# Patient Record
Sex: Female | Born: 1970 | Race: White | Hispanic: No | Marital: Married | Smoking: Never smoker
Health system: Southern US, Community
[De-identification: ages and names within clinical notes are randomized; demographics above are authoritative.]

## PROBLEM LIST (undated history)

## (undated) DIAGNOSIS — R7303 Prediabetes: Secondary | ICD-10-CM

## (undated) DIAGNOSIS — R030 Elevated blood-pressure reading, without diagnosis of hypertension: Secondary | ICD-10-CM

## (undated) DIAGNOSIS — I10 Essential (primary) hypertension: Secondary | ICD-10-CM

## (undated) DIAGNOSIS — E785 Hyperlipidemia, unspecified: Secondary | ICD-10-CM

## (undated) HISTORY — PX: NO PAST SURGERIES: SHX2092

## (undated) HISTORY — DX: Hyperlipidemia, unspecified: E78.5

## (undated) HISTORY — DX: Prediabetes: R73.03

## (undated) HISTORY — DX: Essential (primary) hypertension: I10

## (undated) HISTORY — DX: Elevated blood-pressure reading, without diagnosis of hypertension: R03.0

---

## 1997-12-08 ENCOUNTER — Other Ambulatory Visit: Admission: RE | Admit: 1997-12-08 | Discharge: 1997-12-08 | Payer: Self-pay | Admitting: Obstetrics and Gynecology

## 1997-12-22 ENCOUNTER — Ambulatory Visit (HOSPITAL_COMMUNITY): Admission: RE | Admit: 1997-12-22 | Discharge: 1997-12-22 | Payer: Self-pay | Admitting: Obstetrics & Gynecology

## 1997-12-24 ENCOUNTER — Ambulatory Visit (HOSPITAL_COMMUNITY): Admission: RE | Admit: 1997-12-24 | Discharge: 1997-12-24 | Payer: Self-pay | Admitting: Obstetrics and Gynecology

## 1998-01-14 ENCOUNTER — Ambulatory Visit (HOSPITAL_COMMUNITY): Admission: RE | Admit: 1998-01-14 | Discharge: 1998-01-14 | Payer: Self-pay | Admitting: Obstetrics and Gynecology

## 1999-01-27 ENCOUNTER — Other Ambulatory Visit: Admission: RE | Admit: 1999-01-27 | Discharge: 1999-01-27 | Payer: Self-pay | Admitting: Obstetrics and Gynecology

## 1999-09-05 ENCOUNTER — Observation Stay (HOSPITAL_COMMUNITY): Admission: AD | Admit: 1999-09-05 | Discharge: 1999-09-05 | Payer: Self-pay | Admitting: Obstetrics and Gynecology

## 1999-12-16 ENCOUNTER — Ambulatory Visit (HOSPITAL_COMMUNITY): Admission: RE | Admit: 1999-12-16 | Discharge: 1999-12-16 | Payer: Self-pay | Admitting: Obstetrics & Gynecology

## 1999-12-20 ENCOUNTER — Encounter: Admission: RE | Admit: 1999-12-20 | Discharge: 2000-03-19 | Payer: Self-pay | Admitting: Obstetrics & Gynecology

## 2000-02-29 ENCOUNTER — Inpatient Hospital Stay (HOSPITAL_COMMUNITY): Admission: AD | Admit: 2000-02-29 | Discharge: 2000-03-02 | Payer: Self-pay | Admitting: Obstetrics and Gynecology

## 2000-03-30 ENCOUNTER — Other Ambulatory Visit: Admission: RE | Admit: 2000-03-30 | Discharge: 2000-03-30 | Payer: Self-pay | Admitting: Obstetrics and Gynecology

## 2001-04-25 ENCOUNTER — Other Ambulatory Visit: Admission: RE | Admit: 2001-04-25 | Discharge: 2001-04-25 | Payer: Self-pay | Admitting: Obstetrics and Gynecology

## 2001-12-18 ENCOUNTER — Encounter: Payer: Self-pay | Admitting: Family Medicine

## 2001-12-18 ENCOUNTER — Encounter: Admission: RE | Admit: 2001-12-18 | Discharge: 2001-12-18 | Payer: Self-pay | Admitting: Family Medicine

## 2002-09-30 ENCOUNTER — Other Ambulatory Visit: Admission: RE | Admit: 2002-09-30 | Discharge: 2002-09-30 | Payer: Self-pay | Admitting: Obstetrics and Gynecology

## 2002-11-26 ENCOUNTER — Encounter: Admission: RE | Admit: 2002-11-26 | Discharge: 2002-11-26 | Payer: Self-pay | Admitting: Gastroenterology

## 2002-12-03 ENCOUNTER — Encounter: Admission: RE | Admit: 2002-12-03 | Discharge: 2002-12-03 | Payer: Self-pay | Admitting: Gastroenterology

## 2004-03-02 ENCOUNTER — Other Ambulatory Visit: Admission: RE | Admit: 2004-03-02 | Discharge: 2004-03-02 | Payer: Self-pay | Admitting: Obstetrics and Gynecology

## 2005-03-10 ENCOUNTER — Other Ambulatory Visit: Admission: RE | Admit: 2005-03-10 | Discharge: 2005-03-10 | Payer: Self-pay | Admitting: Obstetrics and Gynecology

## 2005-07-05 ENCOUNTER — Ambulatory Visit (HOSPITAL_COMMUNITY): Admission: RE | Admit: 2005-07-05 | Discharge: 2005-07-05 | Payer: Self-pay | Admitting: Obstetrics and Gynecology

## 2005-09-19 ENCOUNTER — Inpatient Hospital Stay (HOSPITAL_COMMUNITY): Admission: AD | Admit: 2005-09-19 | Discharge: 2005-09-25 | Payer: Self-pay | Admitting: Obstetrics and Gynecology

## 2010-02-17 ENCOUNTER — Ambulatory Visit
Admission: RE | Admit: 2010-02-17 | Discharge: 2010-02-17 | Payer: Self-pay | Source: Home / Self Care | Attending: Family Medicine | Admitting: Family Medicine

## 2010-02-17 DIAGNOSIS — D649 Anemia, unspecified: Secondary | ICD-10-CM | POA: Insufficient documentation

## 2010-02-17 DIAGNOSIS — J309 Allergic rhinitis, unspecified: Secondary | ICD-10-CM | POA: Insufficient documentation

## 2010-02-20 ENCOUNTER — Encounter: Payer: Self-pay | Admitting: Family Medicine

## 2010-03-03 NOTE — Assessment & Plan Note (Signed)
Summary: new acute-ear issues--dizziness-ok per dr//ccm   Vital Signs:  Patient profile:   40 year old female Height:      62.5 inches Weight:      159 pounds BMI:     28.72 Temp:     98.2 degrees F oral Pulse rate:   108 / minute Pulse rhythm:   regular BP sitting:   142 / 90  (left arm) Cuff size:   regular  Vitals Entered By: Alfred Levins, CMA (February 17, 2010 3:38 PM)  Nutrition Counseling: Patient's BMI is greater than 25 and therefore counseled on weight management options. CC: est, rt earache this am, dizziness x2 wks   History of Present Illness: New patient to establish care. Patient seen with acute problem of mild right earache this morning. Intermittent symptoms for 2 weeks. She's had some intermittent very brief vertigo. No hearing changes. Recent URI type symptoms. She also some chronic perennial allergies and takes over-the-counter medications for that. Denies fever. No left ear symptoms.  Past medical history significant for remote history of anemia. History of frequent sinus infections. Perennial allergies. No prescription medications. Allergy to sulfa. Allergy to dust mite and mold and controlled fairly well with OTC antihistamine.  Family history significant for father CABG age 1. Probable history of hyperlipidemia and hypertension. Mother type 2 diabetes.  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 Nonsmoker. Occasional alcohol use.  Preventive Screening-Counseling & Management  Alcohol-Tobacco     Smoking Status: never  Caffeine-Diet-Exercise     Does Patient Exercise: no      Drug Use:  no.    Allergies (verified): 1)  ! Sulfa  Past History:  Family History: Last updated: 02/17/2010 Family History of CAD Female 1st degree relative <50 Fa CABG 67 Family History Diabetes 1st degree relative Mother Type 2 Family History High cholesterol Family History Hypertension  Social History: Last updated: 02/17/2010 Married Never Smoked Alcohol use-yes Drug  use-no Regular exercise-no  Risk Factors: Exercise: no (02/17/2010)  Risk Factors: Smoking Status: never (02/17/2010)  Past Medical History: Allergic rhinitis Anemia-NOS Chickenpox  Past Surgical History: Denies surgical history PMH-FH-SH reviewed for relevance  Family History: Family History of CAD Female 1st degree relative <50 Fa CABG 62 Family History Diabetes 1st degree relative Mother Type 2 Family History High cholesterol Family History Hypertension  Social History: Married Never Smoked Alcohol use-yes Drug use-no Regular exercise-no Smoking Status:  never Drug Use:  no Does Patient Exercise:  no  Review of Systems       The patient complains of weight gain.  The patient denies anorexia, fever, weight loss, decreased hearing, hoarseness, chest pain, syncope, dyspnea on exertion, peripheral edema, prolonged cough, headaches, hemoptysis, abdominal pain, melena, hematochezia, severe indigestion/heartburn, difficulty walking, and enlarged lymph nodes.    Physical Exam  General:  Well-developed,well-nourished,in no acute distress; alert,appropriate and cooperative throughout examination Eyes:  No corneal or conjunctival inflammation noted. EOMI. Perrla. Funduscopic exam benign, without hemorrhages, exudates or papilledema. Vision grossly normal. Ears:  small R serous effusion.  No erythema.  L TM normal. Mouth:  Oral mucosa and oropharynx without lesions or exudates.  Teeth in good repair. Neck:  No deformities, masses, or tenderness noted. Lungs:  Normal respiratory effort, chest expands symmetrically. Lungs are clear to auscultation, no crackles or wheezes. Heart:  Normal rate and regular rhythm. S1 and S2 normal without gallop, murmur, click, rub or other extra sounds. Extremities:  No clubbing, cyanosis, edema, or deformity noted with normal full range of motion of  all joints.   Neurologic:  alert & oriented X3, cranial nerves II-XII intact, strength normal in all  extremities, and finger-to-nose normal.  Mild horizontal nystagmus. Psych:  normally interactive, good eye contact, not anxious appearing, and not depressed appearing.     Impression & Recommendations:  Problem # 1:  ACUTE SEROUS OTITIS MEDIA (ICD-381.01) reassurance.  Cont antihistamines for allergy symptoms.  Problem # 2:  INTERMITTENT VERTIGO (ICD-780.4) prob sec to #1.  Problem # 3:  ALLERGIC RHINITIS (ICD-477.9) good control with OTC meds and she will cont the same.  Complete Medication List: 1)  Ibuprofen 800 Mg Tabs (Ibuprofen) .... Once daily as needed 2)  [place]  .Marland Kitchen.. 1 by mouth once daily as needed 3)  Pseudophedrine  .Marland KitchenMarland Kitchen. 1 by mouth once daily  Patient Instructions: 1)  Call or follow up if you have any new neurologic symptoms, hearing loss, or any persistent vertigo. 2)  You have a small serous effusion and these usually are self-limited and resolve over time without specific drug therapy.   Orders Added: 1)  New Patient Level III [69629]

## 2010-06-17 NOTE — Discharge Summary (Signed)
NAMEWILDA, Brooke Potter            ACCOUNT NO.:  [id number]   MEDICAL RECORD NO.:  [id number]          PATIENT TYPE:  INP   LOCATION:  9104                          FACILITY:  [facility]   PHYSICIAN:  Gerrit Friends. Aldona Bar, M.D.   DATE OF BIRTH:  02-28-1970   DATE OF ADMISSION:  09/19/2005  DATE OF DISCHARGE:  09/25/2005                                 DISCHARGE SUMMARY   DISCHARGE DIAGNOSES:  1. Term pregnancy delivered 7 pound 15 ounce female infant, Apgars 9 and      9.  2. Blood type A negative, Rhogam given.  3. Postpartum pre-eclampsia.   PROCEDURE:  1. Normal spontaneous delivery.  2. Second degree tear and repair.  3. Magnesium sulfate treatment for severe pre-eclampsia postpartum.   SUMMARY:  This 40 year old gravida 3, para 1 was admitted at term in labor  with rupture of membranes.  At time of admission her blood pressure was  145/86.  She did also have a slightly low platelet count on admission  (105,000).  She labored and delivered a 7 pound 15 ounce female infant with  good Apgars over a second degree tear which was repaired without difficulty.  She did receive Rhogam during the early part of her postpartum course.  On  August 25, her pressure was 140/92.  Her liver function tests were slightly  elevated and later during the day on August 25, her pressure elevated to the  160/100 level and she was transferred to the AICU and magnesium sulfate was  begun. Her labs were not markedly abnormal.  On August 26 she was  transferred back to the mother baby unit.  Lab work was repeated.  When I  saw the patient on the morning of September 25, 2005, her blood pressure was  130/82, she had 3+ reflexes and liver function tests which were done on  August 26 were found to be elevated more so than when she was placed in the  AICU to treat her severe pre-eclampsia postpartum.  Her SGOT ws 147, her  SGPT was 164.  Both of these were from August 26.  Her medication of  Procardia to treat her blood  pressure which apparently was somewhat elevated  was changed to Labetalol initially 200 mg every 8 hours and ultimately  increased to 300 mg every 8 hours.  A repeat CBC on the morning of August 27  was normal with normal platelet count.  Her SGOT was 90, down from 147 and  her SGPT was 135 down from 164.  Her LDH was 281, slightly elevated.  A  repeat blood pressure at 1:30 p.m. on September 25, 2005 was 147/97.  The  patient had 2 to 3+ reflexes but no clonus, was comfortable and after a long  discussion with the patient decision was made to sent her home with relative  inactivity at home, continuation of Labetalol 300 mg every eight hours,  continuation of her prenatal vitamins 1 a day, Motrin 400 to 600 mg every  four to six hours as needed for cramping and Tylox one to two every four to  six hours as  needed for severe pain.  She will check in with the office on  August 28 and be seen again on August 29 at which time lab work should be  repeated.  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   The patient will remain at relative inactivity for at least the next 48  hours.  This was stressed and well understood by the patient.   CONDITION ON DISCHARGE:  Somewhat improved.      Gerrit Friends. Aldona Bar, M.D.  Electronically Signed     RMW/MEDQ  D:  09/25/2005  T:  09/26/2005  Job:  [id number]

## 2014-02-12 LAB — HM PAP SMEAR: HM PAP: NEGATIVE

## 2014-05-06 ENCOUNTER — Encounter: Payer: Self-pay | Admitting: Nurse Practitioner

## 2015-02-09 MED FILL — FLUoxetine HCL 10 MG TABS: 10 | 30 days supply | Qty: 30 | Fill #6

## 2017-11-01 ENCOUNTER — Encounter: Payer: Self-pay | Admitting: Sports Medicine

## 2017-11-01 ENCOUNTER — Telehealth: Payer: Self-pay | Admitting: Sports Medicine

## 2017-11-01 ENCOUNTER — Ambulatory Visit (INDEPENDENT_AMBULATORY_CARE_PROVIDER_SITE_OTHER): Payer: 59 | Admitting: Sports Medicine

## 2017-11-01 ENCOUNTER — Ambulatory Visit (INDEPENDENT_AMBULATORY_CARE_PROVIDER_SITE_OTHER): Payer: 59

## 2017-11-01 DIAGNOSIS — X58XXXA Exposure to other specified factors, initial encounter: Secondary | ICD-10-CM | POA: Diagnosis not present

## 2017-11-01 DIAGNOSIS — M7989 Other specified soft tissue disorders: Secondary | ICD-10-CM | POA: Diagnosis not present

## 2017-11-01 DIAGNOSIS — S99922A Unspecified injury of left foot, initial encounter: Secondary | ICD-10-CM | POA: Diagnosis not present

## 2017-11-01 DIAGNOSIS — M25562 Pain in left knee: Secondary | ICD-10-CM | POA: Diagnosis not present

## 2017-11-01 DIAGNOSIS — Y9301 Activity, walking, marching and hiking: Secondary | ICD-10-CM | POA: Diagnosis not present

## 2017-11-01 DIAGNOSIS — M79672 Pain in left foot: Secondary | ICD-10-CM | POA: Diagnosis not present

## 2017-11-01 DIAGNOSIS — S8992XA Unspecified injury of left lower leg, initial encounter: Secondary | ICD-10-CM

## 2017-11-01 DIAGNOSIS — M79662 Pain in left lower leg: Secondary | ICD-10-CM | POA: Diagnosis not present

## 2017-11-01 MED ORDER — MELOXICAM 15 MG PO TABS: ORAL_TABLET | ORAL | 3 refills | Status: AC

## 2017-11-01 NOTE — Telephone Encounter (Signed)
Ms Detter fell..she believes she has fractured her left leg and there is a lot of swelling. She will be here about 4:00

## 2017-11-01 NOTE — Progress Notes (Signed)
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  Did not really get much bruising but the swelling continued to the point where her left anterolateral shin was very tense.  She calls in for an urgent appointment.  I reviewed the past medical history, family history, social history, surgical history, and allergies today and no changes were needed.  Please see the problem list section below in epic for further details.  Past Medical History: Past Medical History:  Diagnosis Date  . Elevated blood pressure reading    Past Surgical History: Past Surgical History:  Procedure Laterality Date  . NO PAST SURGERIES     Social History: Social History   Socioeconomic History  . Marital status: Single    Spouse name: Not on file  . Number of children: Not on file  . Years of education: Not on file  . Highest education level: Not on file  Occupational History  . Not on file  Social Needs  . Financial resource strain: Not on file  . Food insecurity:    Worry: Not on file    Inability: Not on file  . Transportation needs:    Medical: Not on file    Non-medical: Not on file  Tobacco Use  . Smoking status: Never Smoker  . Smokeless tobacco: Never Used  Substance and Sexual Activity  . Alcohol use: Never    Frequency: Never  . Drug use: Never  . Sexual activity: Yes  Lifestyle  . Physical activity:    Days per week: Not on file    Minutes per session: Not on file  . Stress: Not on file  Relationships  . Social connections:    Talks on phone: Not on file    Gets together: Not on file    Attends religious service: Not on file    Active member of club or organization: Not on file    Attends meetings of clubs or organizations: Not on file      Relationship status: Not on file  Other Topics Concern  . Not on file  Social History Narrative  . Not on file   Family History: No family history on file. Allergies: Allergies  Allergen Reactions  . Sulfonamide Derivatives    Medications: See med rec.  Review of Systems: No headache, visual changes, nausea, vomiting, diarrhea, constipation, dizziness, abdominal pain, skin rash, fevers, chills, night sweats, swollen lymph nodes, weight loss, chest pain, body aches, joint swelling, muscle aches, shortness of breath, mood changes, visual or auditory hallucinations.  Objective:    General: Well Developed, well nourished, and in no acute distress.  Neuro: Alert and oriented x3, extra-ocular muscles intact, sensation grossly intact.  HEENT: Normocephalic, atraumatic, pupils equal round reactive to light, neck supple, no masses, no lymphadenopathy, thyroid nonpalpable.  Skin: Warm and dry, no rashes noted.  Cardiac: Regular rate and rhythm, no murmurs rubs or gallops.  Respiratory: Clear to auscultation bilaterally. Not using accessory muscles, speaking in full sentences.  Abdominal: Soft, nontender, nondistended, positive bowel sounds, no masses, no organomegaly.  Left lower leg: Visibly swollen, nontender, no bruising, only a minimal abrasion over the superficial skin.  Neurovascularly intact distally with a negative Homans sign.  X-rays of the knee, tib-fib, ankle reviewed and  are negative for fracture.  Procedure: Diagnostic Ultrasound of left lower leg Device: GE Logiq E  Findings: Noted edematous subcutaneous tissues, overlying the muscle fascia, no discrete collection visible that would be amenable to needle aspiration. Images permanently stored and available for review in the ultrasound unit.  Impression: Subcutaneous edema without fascial edema or discrete collection.  The calf and ankle were strapped with a compressive dressing.  Impression and Recommendations:    The  patient was counselled, risk factors were discussed, anticipatory guidance given.  Left leg injury Knee, tib-fib, ankle x-rays are negative for fracture. Anterolateral leg swelling, ultrasound simply showed subcutaneous edema, no discrete hematoma or collection for drainage. Strap with compressive dressing, meloxicam daily for 2 weeks, icing 20 minutes 3-4 times a day, return to see me in 2 weeks as needed. She will establish with 1 of my partners. ___________________________________________ Ihor Austin. Benjamin Stain, M.D., ABFM., CAQSM. Primary Care and Sports Medicine [facility]  Adjunct Instructor of Family Medicine  [employer]

## 2017-11-01 NOTE — Assessment & Plan Note (Signed)
Knee, tib-fib, ankle x-rays are negative for fracture. Anterolateral leg swelling, ultrasound simply showed subcutaneous edema, no discrete hematoma or collection for drainage. Strap with compressive dressing, meloxicam daily for 2 weeks, icing 20 minutes 3-4 times a day, return to see me in 2 weeks as needed. She will establish with 1 of my partners.

## 2017-11-01 NOTE — Telephone Encounter (Signed)
Unclear what is injured, per report left leg. X-rays of the left knee, tib-fib, ankle, foot.

## 2017-11-15 ENCOUNTER — Ambulatory Visit (INDEPENDENT_AMBULATORY_CARE_PROVIDER_SITE_OTHER): Payer: 59 | Admitting: Sports Medicine

## 2017-11-15 ENCOUNTER — Encounter: Payer: Self-pay | Admitting: Sports Medicine

## 2017-11-15 ENCOUNTER — Ambulatory Visit: Payer: 59 | Admitting: Sports Medicine

## 2017-11-15 DIAGNOSIS — R000 Tachycardia, unspecified: Secondary | ICD-10-CM | POA: Diagnosis not present

## 2017-11-15 DIAGNOSIS — S8992XD Unspecified injury of left lower leg, subsequent encounter: Secondary | ICD-10-CM | POA: Diagnosis not present

## 2017-11-15 DIAGNOSIS — I10 Essential (primary) hypertension: Secondary | ICD-10-CM

## 2017-11-15 MED ORDER — CARVEDILOL 6.25 MG PO TABS: 6.2500 mg | ORAL_TABLET | Freq: Two times a day (BID) | ORAL | 3 refills | Status: DC

## 2017-11-15 MED FILL — CARVEDILOL 6.25 MG TABLET: 6.25 | 30 days supply | Qty: 60 | Fill #0

## 2017-11-15 NOTE — Assessment & Plan Note (Addendum)
Checking routine labs. This is stable and there are no other signs or symptoms to suggest that this is a pheochromocytoma. Adding carvedilol 6.25 mg. Follow-up in 2 weeks with 1 of my partners and establish care visit.

## 2017-11-15 NOTE — Assessment & Plan Note (Signed)
Improving, still has some persistent bruising with evolving colors as expected.   No further intervention needed.

## 2017-11-15 NOTE — Addendum Note (Signed)
Addended by: Monica Becton on: 11/15/2017 03:02 PM   Modules accepted: Orders

## 2017-11-15 NOTE — Progress Notes (Signed)
Subjective:    CC: Follow-up  HPI: Left leg injury: Bruises are resolving but overall doing better.  Hypertension with tachycardia: No headaches, chest pain, visual changes, blood pressure and pulse rate are always elevated per patient report.  Has not yet been treated.  I reviewed the past medical history, family history, social history, surgical history, and allergies today and no changes were needed.  Please see the problem list section below in epic for further details.  Past Medical History: Past Medical History:  Diagnosis Date  . Elevated blood pressure reading    Past Surgical History: Past Surgical History:  Procedure Laterality Date  . NO PAST SURGERIES     Social History: Social History   Socioeconomic History  . Marital status: Single    Spouse name: Not on file  . Number of children: Not on file  . Years of education: Not on file  . Highest education level: Not on file  Occupational History  . Not on file  Social Needs  . Financial resource strain: Not on file  . Food insecurity:    Worry: Not on file    Inability: Not on file  . Transportation needs:    Medical: Not on file    Non-medical: Not on file  Tobacco Use  . Smoking status: Never Smoker  . Smokeless tobacco: Never Used  Substance and Sexual Activity  . Alcohol use: Never    Frequency: Never  . Drug use: Never  . Sexual activity: Yes  Lifestyle  . Physical activity:    Days per week: Not on file    Minutes per session: Not on file  . Stress: Not on file  Relationships  . Social connections:    Talks on phone: Not on file    Gets together: Not on file    Attends religious service: Not on file    Active member of club or organization: Not on file    Attends meetings of clubs or organizations: Not on file    Relationship status: Not on file  Other Topics Concern  . Not on file  Social History Narrative  . Not on file   Family History: No family history on  file. Allergies: Allergies  Allergen Reactions  . Sulfonamide Derivatives    Medications: See med rec.  Review of Systems: No fevers, chills, night sweats, weight loss, chest pain, or shortness of breath.   Objective:    General: Well Developed, well nourished, and in no acute distress.  Neuro: Alert and oriented x3, extra-ocular muscles intact, sensation grossly intact.  HEENT: Normocephalic, atraumatic, pupils equal round reactive to light, neck supple, no masses, no lymphadenopathy, thyroid nonpalpable.  Skin: Warm and dry, no rashes. Cardiac: Tachycardic with regular rhythm, no murmurs rubs or gallops, no lower extremity edema.  Respiratory: Clear to auscultation bilaterally. Not using accessory muscles, speaking in full sentences. Left leg: Still with a bit of swelling, and bruising in various stages of evolution but overall nontender, full range of motion and strength in the left ankle.  Impression and Recommendations:    Left leg injury Improving, still has some persistent bruising with evolving colors as expected.   No further intervention needed.  Tachycardia with hypertension Checking routine labs. This is stable and there are no other signs or symptoms to suggest that this is a pheochromocytoma. Adding carvedilol 6.25 mg. Follow-up in 2 weeks with 1 of my partners and establish care visit. ___________________________________________ Ihor Austin. Benjamin Stain, M.D., ABFM., CAQSM. Primary Care and  Sports Medicine [facility]  Adjunct Professor of [employer]

## 2017-11-16 LAB — HEMOGLOBIN A1C
Hgb A1c MFr Bld: 5.9 % of total Hgb — ABNORMAL HIGH (ref <5.7)
Mean Plasma Glucose: 123 (calc)
eAG (mmol/L): 6.8 (calc)

## 2017-11-16 LAB — COMPREHENSIVE METABOLIC PANEL
AG Ratio: 1.6 (calc) (ref 1.0–2.5)
ALT: 11 U/L (ref 6–29)
AST: 13 U/L (ref 10–35)
Albumin: 4.4 g/dL (ref 3.6–5.1)
Alkaline phosphatase (APISO): 54 U/L (ref 33–115)
CO2: 30 mmol/L (ref 20–32)
Chloride: 100 mmol/L (ref 98–110)
Globulin: 2.7 g/dL (calc) (ref 1.9–3.7)
Glucose, Bld: 130 mg/dL (ref 65–139)
Total Protein: 7.1 g/dL (ref 6.1–8.1)

## 2017-11-16 LAB — CBC
HCT: 35.4 % (ref 35.0–45.0)
Hemoglobin: 11.8 g/dL (ref 11.7–15.5)
MCH: 27.6 pg (ref 27.0–33.0)
MCHC: 33.3 g/dL (ref 32.0–36.0)
MCV: 82.9 fL (ref 80.0–100.0)
MPV: 11 fL (ref 7.5–12.5)
Platelets: 302 10*3/uL (ref 140–400)
RBC: 4.27 10*6/uL (ref 3.80–5.10)
RDW: 13 % (ref 11.0–15.0)
WBC: 7.6 Thousand/uL (ref 3.8–10.8)

## 2017-11-16 LAB — LIPID PANEL W/REFLEX DIRECT LDL
Cholesterol: 220 mg/dL — ABNORMAL HIGH (ref <200)
HDL: 50 mg/dL — ABNORMAL LOW (ref >50)
LDL Cholesterol (Calc): 139 mg/dL — ABNORMAL HIGH
Non-HDL Cholesterol (Calc): 170 mg/dL (calc) — ABNORMAL HIGH (ref <130)
Total CHOL/HDL Ratio: 4.4 (calc) (ref <5.0)
Triglycerides: 169 mg/dL — ABNORMAL HIGH (ref <150)

## 2017-11-16 LAB — COMPREHENSIVE METABOLIC PANEL WITH GFR
BUN: 16 mg/dL (ref 7–25)
Calcium: 9.4 mg/dL (ref 8.6–10.2)
Creat: 0.97 mg/dL (ref 0.50–1.10)
Potassium: 3.9 mmol/L (ref 3.5–5.3)
Sodium: 138 mmol/L (ref 135–146)
Total Bilirubin: 0.4 mg/dL (ref 0.2–1.2)

## 2017-11-16 LAB — TSH: TSH: 1.15 mIU/L

## 2017-11-29 MED FILL — MELOXICAM 15 MG TABLET: 15 | 30 days supply | Qty: 30 | Fill #0

## 2017-12-05 ENCOUNTER — Ambulatory Visit (INDEPENDENT_AMBULATORY_CARE_PROVIDER_SITE_OTHER): Payer: 59 | Admitting: Physician Assistant

## 2017-12-05 ENCOUNTER — Encounter: Payer: Self-pay | Admitting: Physician Assistant

## 2017-12-05 VITALS — BP 143/90 | HR 79 | Wt 173.0 lb

## 2017-12-05 DIAGNOSIS — E782 Mixed hyperlipidemia: Secondary | ICD-10-CM | POA: Diagnosis not present

## 2017-12-05 DIAGNOSIS — I10 Essential (primary) hypertension: Secondary | ICD-10-CM | POA: Diagnosis not present

## 2017-12-05 DIAGNOSIS — Z7689 Persons encountering health services in other specified circumstances: Secondary | ICD-10-CM | POA: Diagnosis not present

## 2017-12-05 DIAGNOSIS — Z23 Encounter for immunization: Secondary | ICD-10-CM

## 2017-12-05 DIAGNOSIS — R7303 Prediabetes: Secondary | ICD-10-CM | POA: Diagnosis not present

## 2017-12-05 DIAGNOSIS — F439 Reaction to severe stress, unspecified: Secondary | ICD-10-CM | POA: Diagnosis not present

## 2017-12-05 DIAGNOSIS — R000 Tachycardia, unspecified: Secondary | ICD-10-CM

## 2017-12-05 DIAGNOSIS — F3281 Premenstrual dysphoric disorder: Secondary | ICD-10-CM

## 2017-12-05 MED ORDER — CARVEDILOL 12.5 MG PO TABS: 12.5000 mg | ORAL_TABLET | Freq: Two times a day (BID) | ORAL | 0 refills | Status: DC

## 2017-12-05 MED ORDER — FLUOXETINE HCL 10 MG PO CAPS: ORAL_CAPSULE | ORAL | 0 refills | Status: DC

## 2017-12-05 MED FILL — FLUoxetine HCL 10 MG CAPS: 10 | 30 days supply | Qty: 60 | Fill #0

## 2017-12-05 MED FILL — CARVEDILOL 12.5 MG TABLET: 12.5 | 90 days supply | Qty: 180 | Fill #0

## 2017-12-05 NOTE — Patient Instructions (Addendum)
Prediabetes Eating Plan Prediabetes-also called impaired glucose tolerance or impaired fasting glucose-is a condition that causes blood sugar (blood glucose) levels to be higher than normal. Following a healthy diet can help to keep prediabetes under control. It can also help to lower the risk of type 2 diabetes and heart disease, which are increased in people who have prediabetes. Along with regular exercise, a healthy diet:  Promotes weight loss.  Helps to control blood sugar levels.  Helps to improve the way that the body uses insulin.  What do I need to know about this eating plan?  Use the glycemic index (GI) to plan your meals. The index tells you how quickly a food will raise your blood sugar. Choose low-GI foods. These foods take a longer time to raise blood sugar.  Pay close attention to the amount of carbohydrates in the food that you eat. Carbohydrates increase blood sugar levels.  Keep track of how many calories you take in. Eating the right amount of calories will help you to achieve a healthy weight. Losing about 7 percent of your starting weight can help to prevent type 2 diabetes.  You may want to follow a Mediterranean diet. This diet includes a lot of vegetables, lean meats or fish, whole grains, fruits, and healthy oils and fats. What foods can I eat? Grains Whole grains, such as whole-wheat or whole-grain breads, crackers, cereals, and pasta. Unsweetened oatmeal. Bulgur. Barley. Quinoa. Brown rice. Corn or whole-wheat flour tortillas or taco shells. Vegetables Lettuce. Spinach. Peas. Beets. Cauliflower. Cabbage. Broccoli. Carrots. Tomatoes. Squash. Eggplant. Herbs. Peppers. Onions. Cucumbers. Brussels sprouts. Fruits Berries. Bananas. Apples. Oranges. Grapes. Papaya. Mango. Pomegranate. Kiwi. Grapefruit. Cherries. Meats and Other Protein Sources Seafood. Lean meats, such as chicken and [place] or lean cuts of pork and beef. Tofu. Eggs. Nuts. Beans. Dairy Low-fat or  fat-free dairy products, such as yogurt, cottage cheese, and cheese. Beverages Water. Tea. Coffee. Sugar-free or diet soda. Seltzer water. Milk. Milk alternatives, such as soy or almond milk. Condiments Mustard. Relish. Low-fat, low-sugar ketchup. Low-fat, low-sugar barbecue sauce. Low-fat or fat-free mayonnaise. Sweets and Desserts Sugar-free or low-fat pudding. Sugar-free or low-fat ice cream and other frozen treats. Fats and Oils Avocado. Walnuts. Olive oil. The items listed above may not be a complete list of recommended foods or beverages. Contact your dietitian for more options. What foods are not recommended? Grains Refined white flour and flour products, such as bread, pasta, snack foods, and cereals. Beverages Sweetened drinks, such as sweet iced tea and soda. Sweets and Desserts Baked goods, such as cake, cupcakes, pastries, cookies, and cheesecake. The items listed above may not be a complete list of foods and beverages to avoid. Contact your dietitian for more information. This information is not intended to replace advice given to you by your health care provider. Make sure you discuss any questions you have with your health care provider. Document Released: 06/02/2014 Document Revised: 06/24/2015 Document Reviewed: 02/11/2014 Elsevier Interactive Patient Education  2017 [place] and Stress Management Stress is a normal reaction to life events. It is what you feel when life demands more than you are used to or more than you can handle. Some stress can be useful. For example, the stress reaction can help you catch the last bus of the day, study for a test, or meet a deadline at work. But stress that occurs too often or for too long can cause problems. It can affect your emotional health and interfere with relationships and  normal daily activities. Too much stress can weaken your immune system and increase your risk for physical illness. If you already have a medical  problem, stress can make it worse. What are the causes? All sorts of life events may cause stress. An event that causes stress for one person may not be stressful for another person. Major life events commonly cause stress. These may be positive or negative. Examples include losing your job, moving into a new home, getting married, having a baby, or losing a loved one. Less obvious life events may also cause stress, especially if they occur day after day or in combination. Examples include working long hours, driving in traffic, caring for children, being in debt, or being in a difficult relationship. What are the signs or symptoms? Stress may cause emotional symptoms including, the following:  Anxiety. This is feeling worried, afraid, on edge, overwhelmed, or out of control.  Anger. This is feeling irritated or impatient.  Depression. This is feeling sad, down, helpless, or guilty.  Difficulty focusing, remembering, or making decisions.  Stress may cause physical symptoms, including the following:  Aches and pains. These may affect your head, neck, back, stomach, or other areas of your body.  Tight muscles or clenched jaw.  Low energy or trouble sleeping.  Stress may cause unhealthy behaviors, including the following:  Eating to feel better (overeating) or skipping meals.  Sleeping too little, too much, or both.  Working too much or putting off tasks (procrastination).  Smoking, drinking alcohol, or using drugs to feel better.  How is this diagnosed? Stress is diagnosed through an assessment by your health care provider. Your health care provider will ask questions about your symptoms and any stressful life events.Your health care provider will also ask about your medical history and may order blood tests or other tests. Certain medical conditions and medicine can cause physical symptoms similar to stress. Mental illness can cause emotional symptoms and unhealthy behaviors similar  to stress. Your health care provider may refer you to a mental health professional for further evaluation. How is this treated? Stress management is the recommended treatment for stress.The goals of stress management are reducing stressful life events and coping with stress in healthy ways. Techniques for reducing stressful life events include the following:  Stress identification. Self-monitor for stress and identify what causes stress for you. These skills may help you to avoid some stressful events.  Time management. Set your priorities, keep a calendar of events, and learn to say "no." These tools can help you avoid making too many commitments.  Techniques for coping with stress include the following:  Rethinking the problem. Try to think realistically about stressful events rather than ignoring them or overreacting. Try to find the positives in a stressful situation rather than focusing on the negatives.  Exercise. Physical exercise can release both physical and emotional tension. The key is to find a form of exercise you enjoy and do it regularly.  Relaxation techniques. These relax the body and mind. Examples include yoga, meditation, tai chi, biofeedback, deep breathing, progressive muscle relaxation, listening to music, being out in nature, journaling, and other hobbies. Again, the key is to find one or more that you enjoy and can do regularly.  Healthy lifestyle. Eat a balanced diet, get plenty of sleep, and do not smoke. Avoid using alcohol or drugs to relax.  Strong support network. Spend time with family, friends, or other people you enjoy being around.Express your feelings and talk things over with  someone you trust.  Counseling or talktherapy with a mental health professional may be helpful if you are having difficulty managing stress on your own. Medicine is typically not recommended for the treatment of stress.Talk to your health care provider if you think you need medicine  for symptoms of stress. Follow these instructions at home:  Keep all follow-up visits as directed by your health care provider.  Take all medicines as directed by your health care provider. Contact a health care provider if:  Your symptoms get worse or you start having new symptoms.  You feel overwhelmed by your problems and can no longer manage them on your own. Get help right away if:  You feel like hurting yourself or someone else. This information is not intended to replace advice given to you by your health care provider. Make sure you discuss any questions you have with your health care provider. Document Released: 07/12/2000 Document Revised: 06/24/2015 Document Reviewed: 09/10/2012 Elsevier Interactive Patient Education  2017 [employer].

## 2017-12-05 NOTE — Progress Notes (Signed)
HPI:                                                                Brooke Potter is a 47 y.o. female who presents to [facility]: Primary Care Sports Medicine today to establish care  Current concerns:  HTN: She was started on carvedilol 6.25 mg bid for tachycardia and hypertension.  Compliant with medications. Checks BP's at home. BP range 120-140's/80's-90's. Denies vision change, headache, chest pain with exertion, orthopnea, lightheadedness, syncope and edema. Risk factors include: HLD  Prediabetes: reports history of gestational diabetes. A1c 5.9, 2 weeks ago. She drinks 5-6 cans of coke per day. Reports "I have a sugar addiction."  Reports she has discussed mood swings associated with hormone changes with her Gynecologist in the past and has tried Prozac 10 mg, which was helpful. She would like to re-start this. She is under a lot of stress. She works full-time as a Dealer on third shift, husband has had some health issues, she is primary caregiver for their 2 children and schedule is very busy and chaotic. She walks daily and will go on 6-7 mile hikes to relieve stress.  Depression screen PHQ 2/9 12/05/2017  Decreased Interest 0  Down, Depressed, Hopeless 0  PHQ - 2 Score 0    No flowsheet data found.    Past Medical History:  Diagnosis Date  . Elevated blood pressure reading   . Hypertension    Past Surgical History:  Procedure Laterality Date  . NO PAST SURGERIES     Social History   Tobacco Use  . Smoking status: Never Smoker  . Smokeless tobacco: Never Used  Substance Use Topics  . Alcohol use: Never    Frequency: Never   family history includes Diabetes in her mother; Heart attack in her father; Hypertension in her father and mother.    ROS: Review of Systems  Musculoskeletal: Positive for joint pain.  Psychiatric/Behavioral:       + mood swings     Medications: Current Outpatient Medications  Medication Sig Dispense  Refill  . carvedilol (COREG) 6.25 MG tablet Take 1 tablet (6.25 mg total) by mouth 2 (two) times daily with a meal. 60 tablet 3  . meloxicam (MOBIC) 15 MG tablet One tab PO qAM with breakfast for 2 weeks, then daily prn pain. 30 tablet 3   No current facility-administered medications for this visit.    Allergies  Allergen Reactions  . Sulfa Antibiotics Rash       Objective:  BP (!) 143/90   Pulse 79   Wt 173 lb (78.5 kg)   LMP 11/30/2017   BMI 31.14 kg/m  Gen:  alert, not ill-appearing, no distress, appropriate for age, obese female HEENT: head normocephalic without obvious abnormality, conjunctiva and cornea clear, trachea midline Pulm: Normal work of breathing, normal phonation, clear to auscultation bilaterally, no wheezes, rales or rhonchi CV: Normal rate, regular rhythm, s1 and s2 distinct, no murmurs, clicks or rubs  Neuro: alert and oriented x 3, no tremor MSK: extremities atraumatic, normal gait and station Skin: intact, no rashes on exposed skin, no jaundice, no cyanosis Psych: well-groomed, cooperative, good eye contact, euthymic mood, affect mood-congruent, speech is articulate, and thought processes clear and goal-directed  Lab  Results  Component Value Date   CREATININE 0.97 11/15/2017   BUN 16 11/15/2017   NA 138 11/15/2017   K 3.9 11/15/2017   CL 100 11/15/2017   CO2 30 11/15/2017   Lab Results  Component Value Date   ALT 11 11/15/2017   AST 13 11/15/2017   BILITOT 0.4 11/15/2017   Lab Results  Component Value Date   WBC 7.6 11/15/2017   HGB 11.8 11/15/2017   HCT 35.4 11/15/2017   MCV 82.9 11/15/2017   PLT 302 11/15/2017   Lab Results  Component Value Date   HGBA1C 5.9 (H) 11/15/2017     No results found for this or any previous visit (from the past 72 hour(s)). No results found.    Assessment and Plan: 47 y.o. female with  - Personally reviewed PMH, PSH, PFH, medications, allergies, HM - Age-appropriate cancer screening: Pap UTD per  patient, declined mammogram - Influenza UTD - Tdap given today - PHQ2 negative - Personally reviewed labs from 10/2017  .Brooke Potter was seen today for establish care.  Diagnoses and all orders for this visit:  Encounter to establish care  Hypertension goal BP (blood pressure) < 130/80 -     carvedilol (COREG) 12.5 MG tablet; Take 1 tablet (12.5 mg total) by mouth 2 (two) times daily with a meal.  PMDD (premenstrual dysphoric disorder) -     FLUoxetine (PROZAC) 10 MG capsule; Take 1 capsule (10 mg total) by mouth at bedtime for 3 days, THEN 2 capsules (20 mg total) at bedtime for 27 days.  Mixed hyperlipidemia  Tachycardia with hypertension -     carvedilol (COREG) 12.5 MG tablet; Take 1 tablet (12.5 mg total) by mouth 2 (two) times daily with a meal.   Hypertension/tachycardia: In office and home BP readings are still in the stage I hypertensive range on current dose of carvedilol.  Rate control is good.  Increasing to 12-1/2 mg twice a day.  If her heart rate does not tolerate this or if she has any side effects we will reduce back to 6.25 mg twice a day and add low-dose ACE inhibitor or calcium channel blocker.  Counseled on therapeutic lifestyle changes.  Prediabetes/HLD: A1c 5.9.  Discussed prediabetes eating plan.  Specifically discussed reducing soda intake.  PMDD: PHQ2 negative. Starting low-dose Fluoxetine 10 mg QHS, option to self-titrate to 20 mg  Patient education and anticipatory guidance given Patient agrees with treatment plan Follow-up in 2 months or sooner as needed if symptoms worsen or fail to improve  Levonne Hubert PA-C

## 2018-01-31 ENCOUNTER — Encounter: Payer: Self-pay | Admitting: Physician Assistant

## 2018-02-06 ENCOUNTER — Encounter: Payer: Self-pay | Admitting: Physician Assistant

## 2018-02-06 ENCOUNTER — Ambulatory Visit (INDEPENDENT_AMBULATORY_CARE_PROVIDER_SITE_OTHER): Payer: 59 | Admitting: Physician Assistant

## 2018-02-06 VITALS — BP 156/98 | HR 76 | Wt 172.0 lb

## 2018-02-06 DIAGNOSIS — I10 Essential (primary) hypertension: Secondary | ICD-10-CM | POA: Diagnosis not present

## 2018-02-06 DIAGNOSIS — Z1231 Encounter for screening mammogram for malignant neoplasm of breast: Secondary | ICD-10-CM | POA: Diagnosis not present

## 2018-02-06 DIAGNOSIS — F3281 Premenstrual dysphoric disorder: Secondary | ICD-10-CM | POA: Diagnosis not present

## 2018-02-06 DIAGNOSIS — R000 Tachycardia, unspecified: Secondary | ICD-10-CM

## 2018-02-06 MED ORDER — FLUOXETINE HCL 10 MG PO CAPS: 10.0000 mg | ORAL_CAPSULE | Freq: Every day | ORAL | 1 refills | Status: AC

## 2018-02-06 MED ORDER — CARVEDILOL 12.5 MG PO TABS: 12.5000 mg | ORAL_TABLET | Freq: Two times a day (BID) | ORAL | 1 refills | Status: AC

## 2018-02-06 MED FILL — FLUoxetine HCL 10 MG CAPS: 10 | 90 days supply | Qty: 90 | Fill #0

## 2018-02-06 NOTE — Patient Instructions (Addendum)
Mammogram [phone]   For your blood pressure: - Goal <130/80 (Ideally 120's/70's) - monitor and log blood pressures at home - check around the same time each day in a relaxed setting - Limit salt to <2500 mg/day - Follow DASH (Dietary Approach to Stopping Hypertension) eating plan - Try to get at least 150 minutes of aerobic exercise per week - Aim to go on a brisk walk 30 minutes per day at least 5 days per week. If you're not active, gradually increase how long you walk by 5 minutes each week - limit alcohol: 2 standard drinks per day for men and 1 per day for women - avoid tobacco/nicotine products. Consider smoking cessation if you smoke - weight loss: 7% of current body weight can reduce your blood pressure by 5-10 points - follow-up at least every 6 months for your blood pressure. Follow-up sooner if your BP is not controlled

## 2018-02-06 NOTE — Progress Notes (Signed)
HPI:                                                                Brooke Potter is a 48 y.o. female who presents to [facility]: Primary Care Sports Medicine today for HTN follow-up  Current concerns:  HTN: She was started on carvedilol 6.25 mg bid for tachycardia and hypertension.  Compliant with medications. Checks BP's at home. BP range low 100's-125/70's-80's, HR 70's-80's. She walks daily and will go on 6-7 mile hikes to relieve stress. Denies vision change, headache, chest pain with exertion, orthopnea, lightheadedness, syncope and edema. Risk factors include: HLD  PMDD: started on Fluoxetine 2 months ago. Has noticed an improvement in mood swings. Did not self-titrate to 20 mg.    Depression screen [facility] 2/9 02/06/2018 12/05/2017  Decreased Interest 0 0  Down, Depressed, Hopeless 0 0  PHQ - 2 Score 0 0    GAD 7 : Generalized Anxiety Score 02/06/2018  Nervous, Anxious, on Edge 0  Control/stop worrying 0  Worry too much - different things 1  Trouble relaxing 0  Restless 0  Easily annoyed or irritable 1  Afraid - awful might happen 0  Total GAD 7 Score 2      Past Medical History:  Diagnosis Date  . Elevated blood pressure reading   . Hyperlipidemia   . Hypertension   . Prediabetes    Past Surgical History:  Procedure Laterality Date  . NO PAST SURGERIES     Social History   Tobacco Use  . Smoking status: Never Smoker  . Smokeless tobacco: Never Used  Substance Use Topics  . Alcohol use: Never    Frequency: Never   family history includes Diabetes in her mother; Heart attack in her father; Hypertension in her father and mother.    ROS: Review of Systems  Musculoskeletal: Positive for joint pain.  Psychiatric/Behavioral:       + mood swings     Medications: Current Outpatient Medications  Medication Sig Dispense Refill  . carvedilol (COREG) 12.5 MG tablet Take 1 tablet (12.5 mg total) by mouth 2 (two) times daily with a meal.  180 tablet 1  . FLUoxetine (PROZAC) 10 MG capsule Take 1 capsule (10 mg total) by mouth at bedtime. 90 capsule 1  . meloxicam (MOBIC) 15 MG tablet One tab PO qAM with breakfast for 2 weeks, then daily prn pain. 30 tablet 3   No current facility-administered medications for this visit.    Allergies  Allergen Reactions  . Sulfa Antibiotics Rash       Objective:  BP (!) 156/98   Pulse 76   Wt 172 lb (78 kg)   BMI 30.96 kg/m  Gen:  alert, not ill-appearing, no distress, appropriate for age, obese female HEENT: head normocephalic without obvious abnormality, conjunctiva and cornea clear, trachea midline Pulm: Normal work of breathing, normal phonation, clear to auscultation bilaterally, no wheezes, rales or rhonchi CV: Normal rate, regular rhythm, s1 and s2 distinct, no murmurs, clicks or rubs  Neuro: alert and oriented x 3, no tremor MSK: extremities atraumatic, normal gait and station Skin: intact, no rashes on exposed skin, no jaundice, no cyanosis Psych: well-groomed, cooperative, good eye contact, euthymic mood, affect mood-congruent, speech is articulate, and  thought processes clear and goal-directed  Lab Results  Component Value Date   CREATININE 0.97 11/15/2017   BUN 16 11/15/2017   NA 138 11/15/2017   K 3.9 11/15/2017   CL 100 11/15/2017   CO2 30 11/15/2017   Lab Results  Component Value Date   ALT 11 11/15/2017   AST 13 11/15/2017   BILITOT 0.4 11/15/2017   Lab Results  Component Value Date   WBC 7.6 11/15/2017   HGB 11.8 11/15/2017   HCT 35.4 11/15/2017   MCV 82.9 11/15/2017   PLT 302 11/15/2017   Lab Results  Component Value Date   HGBA1C 5.9 (H) 11/15/2017     No results found for this or any previous visit (from the past 72 hour(s)). No results found.    Assessment and Plan: 48 y.o. female with  - Personally reviewed PMH, PSH, PFH, medications, allergies, HM - Age-appropriate cancer screening: Pap UTD per patient, declined  mammogram - Influenza UTD - Tdap given today - PHQ2 negative - Personally reviewed labs from 10/2017  .[place] was seen today for hypertension.  Diagnoses and all orders for this visit:  White coat syndrome with diagnosis of hypertension  Hypertension goal BP (blood pressure) < 130/80 -     carvedilol (COREG) 12.5 MG tablet; Take 1 tablet (12.5 mg total) by mouth 2 (two) times daily with a meal.  Tachycardia with hypertension -     carvedilol (COREG) 12.5 MG tablet; Take 1 tablet (12.5 mg total) by mouth 2 (two) times daily with a meal.  PMDD (premenstrual dysphoric disorder) -     FLUoxetine (PROZAC) 10 MG capsule; Take 1 capsule (10 mg total) by mouth at bedtime.  Breast cancer screening by mammogram -     MM 3D SCREEN BREAST BILATERAL; Future   Hypertension/tachycardia: BP out of range in office, home readings in range. Rate control is good.  Cont Carvedilol at current dose.  Counseled on therapeutic lifestyle changes.   PMDD: GAD7=2, Cont Fluoxetine 10 mg QHS  Patient education and anticipatory guidance given Patient agrees with treatment plan Follow-up in 3 months for well woman exam or sooner as needed if symptoms worsen or fail to improve  Levonne Hubert PA-C

## 2018-03-14 MED FILL — CARVEDILOL 12.5 MG TABLET: 12.5 | 90 days supply | Qty: 180 | Fill #0

## 2018-07-30 MED FILL — MELOXICAM 15 MG TABLET: 15 | 30 days supply | Qty: 30 | Fill #1

## 2018-07-30 MED FILL — FLUoxetine HCL 10 MG CAPS: 10 | 90 days supply | Qty: 90 | Fill #1

## 2018-07-30 MED FILL — CARVEDILOL 12.5 MG TABLET: 12.5 | 90 days supply | Qty: 180 | Fill #1
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
